# Patient Record
Sex: Male | Born: 1985 | Race: White | Hispanic: No | Marital: Single | Smoking: Never smoker
Health system: Southern US, Community
[De-identification: ages and names within clinical notes are randomized; demographics above are authoritative.]

## PROBLEM LIST (undated history)

## (undated) DIAGNOSIS — F845 Asperger's syndrome: Secondary | ICD-10-CM

---

## 2016-03-18 ENCOUNTER — Emergency Department (HOSPITAL_COMMUNITY)
Admission: EM | Admit: 2016-03-18 | Discharge: 2016-03-18 | Disposition: A | Discharge status: Home / Self Care | Payer: Medicaid Other | Attending: Emergency Medicine | Admitting: Emergency Medicine

## 2016-03-18 ENCOUNTER — Encounter (HOSPITAL_COMMUNITY): Payer: Self-pay | Admitting: Emergency Medicine

## 2016-03-18 DIAGNOSIS — R079 Chest pain, unspecified: Secondary | ICD-10-CM | POA: Diagnosis present

## 2016-03-18 DIAGNOSIS — Y999 Unspecified external cause status: Secondary | ICD-10-CM | POA: Insufficient documentation

## 2016-03-18 DIAGNOSIS — Y9389 Activity, other specified: Secondary | ICD-10-CM | POA: Insufficient documentation

## 2016-03-18 DIAGNOSIS — R110 Nausea: Secondary | ICD-10-CM | POA: Insufficient documentation

## 2016-03-18 DIAGNOSIS — M791 Myalgia: Secondary | ICD-10-CM | POA: Insufficient documentation

## 2016-03-18 DIAGNOSIS — Y92410 Unspecified street and highway as the place of occurrence of the external cause: Secondary | ICD-10-CM | POA: Diagnosis not present

## 2016-03-18 DIAGNOSIS — M7918 Myalgia, other site: Secondary | ICD-10-CM

## 2016-03-18 HISTORY — DX: Asperger's syndrome: F84.5
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
## 2016-03-19 ENCOUNTER — Other Ambulatory Visit: Payer: Self-pay | Admitting: Family Medicine

## 2016-03-19 ENCOUNTER — Ambulatory Visit
Admission: RE | Admit: 2016-03-19 | Discharge: 2016-03-19 | Disposition: A | Discharge status: Home / Self Care | Payer: Medicaid Other | Source: Ambulatory Visit | Attending: Family Medicine | Admitting: Family Medicine

## 2016-03-19 DIAGNOSIS — M542 Cervicalgia: Secondary | ICD-10-CM

## 2016-04-01 ENCOUNTER — Encounter: Payer: Self-pay | Admitting: Physical Therapy

## 2016-04-01 ENCOUNTER — Ambulatory Visit: Payer: Medicaid Other | Attending: Family Medicine | Admitting: Physical Therapy

## 2016-04-01 DIAGNOSIS — M542 Cervicalgia: Secondary | ICD-10-CM

## 2016-04-01 DIAGNOSIS — R252 Cramp and spasm: Secondary | ICD-10-CM | POA: Insufficient documentation
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
## 2016-04-02 ENCOUNTER — Emergency Department (HOSPITAL_COMMUNITY): Payer: Medicaid Other

## 2016-04-02 ENCOUNTER — Emergency Department (HOSPITAL_COMMUNITY)
Admission: EM | Admit: 2016-04-02 | Discharge: 2016-04-02 | Disposition: A | Discharge status: Home / Self Care | Payer: Medicaid Other | Attending: Emergency Medicine | Admitting: Emergency Medicine

## 2016-04-02 ENCOUNTER — Encounter (HOSPITAL_COMMUNITY): Payer: Self-pay | Admitting: Emergency Medicine

## 2016-04-02 DIAGNOSIS — T6291XA Toxic effect of unspecified noxious substance eaten as food, accidental (unintentional), initial encounter: Secondary | ICD-10-CM | POA: Diagnosis not present

## 2016-04-02 DIAGNOSIS — Z79899 Other long term (current) drug therapy: Secondary | ICD-10-CM | POA: Diagnosis not present

## 2016-04-02 DIAGNOSIS — R112 Nausea with vomiting, unspecified: Secondary | ICD-10-CM | POA: Diagnosis present

## 2016-04-02 DIAGNOSIS — A059 Bacterial foodborne intoxication, unspecified: Secondary | ICD-10-CM

## 2016-04-02 LAB — BASIC METABOLIC PANEL
ANION GAP: 5 (ref 5–15)
BUN: 22 mg/dL — AB (ref 6–20)
CALCIUM: 9.4 mg/dL (ref 8.9–10.3)
CO2: 26 mmol/L (ref 22–32)
Chloride: 105 mmol/L (ref 101–111)
Creatinine, Ser: 0.81 mg/dL (ref 0.61–1.24)
GFR calc Af Amer: >60 mL/min (ref >60)
GLUCOSE: 88 mg/dL (ref 65–99)
POTASSIUM: 4.1 mmol/L (ref 3.5–5.1)
SODIUM: 136 mmol/L (ref 135–145)

## 2016-04-02 LAB — I-STAT TROPONIN, ED: TROPONIN I, POC: 0 ng/mL (ref 0.00–0.08)

## 2016-04-02 LAB — CBC
HCT: 47.2 % (ref 39.0–52.0)
Hemoglobin: 16.3 g/dL (ref 13.0–17.0)
MCH: 28.7 pg (ref 26.0–34.0)
MCHC: 34.5 g/dL (ref 30.0–36.0)
MCV: 83.1 fL (ref 78.0–100.0)
PLATELETS: 290 10*3/uL (ref 150–400)
RBC: 5.68 MIL/uL (ref 4.22–5.81)
RDW: 12.6 % (ref 11.5–15.5)
WBC: 4.8 10*3/uL (ref 4.0–10.5)

## 2016-04-02 LAB — URINALYSIS, ROUTINE W REFLEX MICROSCOPIC
BILIRUBIN URINE: NEGATIVE
GLUCOSE, UA: NEGATIVE mg/dL
Hgb urine dipstick: NEGATIVE
KETONES UR: NEGATIVE mg/dL
LEUKOCYTES UA: NEGATIVE
Nitrite: NEGATIVE
PH: 5.5 (ref 5.0–8.0)
Protein, ur: NEGATIVE mg/dL
Specific Gravity, Urine: 1.014 (ref 1.005–1.030)

## 2016-04-02 MED ORDER — FAMOTIDINE IN NACL 20-0.9 MG/50ML-% IV SOLN
20.0000 mg | Freq: Two times a day (BID) | INTRAVENOUS | Status: DC
  Administered 2016-04-02: 20 mg via INTRAVENOUS
  Filled 2016-04-02: qty 50

## 2016-04-02 MED ORDER — FENTANYL CITRATE (PF) 100 MCG/2ML IJ SOLN
50.0000 ug | Freq: Once | INTRAMUSCULAR | Status: AC
  Administered 2016-04-02: 50 ug via INTRAVENOUS
  Filled 2016-04-02: qty 2

## 2016-04-02 MED ORDER — SODIUM CHLORIDE 0.9 % IV BOLUS (SEPSIS)
1000.0000 mL | Freq: Once | INTRAVENOUS | Status: AC
  Administered 2016-04-02: 1000 mL via INTRAVENOUS

## 2016-04-02 MED ORDER — ONDANSETRON HCL 4 MG/2ML IJ SOLN
4.0000 mg | Freq: Once | INTRAMUSCULAR | Status: AC
  Administered 2016-04-02: 4 mg via INTRAVENOUS
  Filled 2016-04-02: qty 2

## 2016-04-02 MED ORDER — ONDANSETRON 4 MG PREPACK (~~LOC~~): 1.0000 | ORAL_TABLET | Freq: Three times a day (TID) | ORAL | 0 refills | Status: AC | PRN

## 2016-04-02 NOTE — ED Provider Notes (Signed)
[facility]-EMERG[facility]Y DEPT Provider Note   CSN: [id number] Arrival date & time: 04/02/16  1191  First Provider Contact:  None       History   Chief Complaint Chief Complaint  Patient presents with  . Chest Pain  . Emesis    HPI Barry Dennis is a 30 y.o. male.  HPI Pt c/o chest pain, nausea, emesis, weakness, pallor, generalized abdominal pain onset this morning after eating questionable food last night  Past Medical History:  Diagnosis Date  . Asperger's disorder     There are no active problems to display for this patient.   History reviewed. No pertinent surgical history.     Home Medications    Prior to Admission medications   Medication Sig Start Date End Date Taking? Authorizing Provider  calcium carbonate (TUMS - DOSED IN MG ELEMENTAL CALCIUM) 500 MG chewable tablet Chew 1 tablet by mouth as needed for indigestion or heartburn.   Yes Historical Provider, MD  cyclobenzaprine (FLEXERIL) 10 MG tablet Take 10 mg by mouth at bedtime as needed for muscle spasms.  03/19/16  Yes Historical Provider, MD  escitalopram (LEXAPRO) 10 MG tablet Take 10 mg by mouth daily. 03/19/16  Yes Historical Provider, MD  meloxicam (MOBIC) 15 MG tablet Take 15 mg by mouth daily as needed for pain.  03/19/16  Yes Historical Provider, MD  ondansetron (ZOFRAN) 4 mg TABS tablet Take 4 tablets by mouth every 8 (eight) hours as needed. 04/02/16   Nelva Nay, MD    Family History History reviewed. No pertinent family history.  Social History Social History  Substance Use Topics  . Smoking status: Never Smoker  . Smokeless tobacco: Never Used  . Alcohol use No     Allergies   Review of patient's allergies indicates no known allergies.   Review of Systems Review of Systems All other systems reviewed and are negative  Physical Exam Updated Vital Signs BP 111/79 (BP Location: Left Arm)   Pulse 65   Temp 97.9 F (36.6 C) (Oral)   Resp 10   Ht  (1.778 m)   Wt 128 lb (58.1 kg)    SpO2 100%   BMI 18.37 kg/m   Physical Exam Physical Exam  Nursing note and vitals reviewed. Constitutional: He is oriented to person, place, and time. He appears well-developed and well-nourished. No distress.  HENT:  Head: Normocephalic and atraumatic.  Eyes: Pupils are equal, round, and reactive to light.  Neck: Normal range of motion.  Cardiovascular: Normal rate and intact distal pulses.   Pulmonary/Chest: No respiratory distress.  Abdominal: Normal appearance. He exhibits no distension.  mildly tender in the epigastric area.  No rebound or guarding tenderness.  Hyperactive bowel sounds in all quadrants. Musculoskeletal: Normal range of motion.  Neurological: He is alert and oriented to person, place, and time. No cranial nerve deficit.  Skin: Skin is warm and dry. No rash noted.  Psychiatric: He has a normal mood and affect. His behavior is normal.    ED Treatments / Results  Labs (all labs ordered are listed, but only abnormal results are displayed) Labs Reviewed  BASIC METABOLIC PANEL - Abnormal; Notable for the following:       Result Value   BUN 22 (*)    All other components within normal limits  CBC  URINALYSIS, ROUTINE W REFLEX MICROSCOPIC (NOT AT [facility])  Rosezena Sensor, ED    EKG  EKG Interpretation  Date/Time:  Tuesday April 02 2016 09:58:41 EDT Ventricular Rate:  73 PR Interval:    QRS Duration: 90 QT Interval:  375 QTC Calculation: 414 R Axis:   -21 Text Interpretation:  Sinus rhythm Borderline left axis deviation RSR' in V1 or V2, probably normal variant No previous tracing Confirmed by Tenelle Andreason  MD, Zivah Mayr [phone]([id number]) on 04/02/2016 12:26:19 PM       Radiology Dg Chest 2 View  ResultRadford Pax Date: 04/02/2016 CLINICAL DATA:  30 year old male with a history of sharp pain in the chest EXAM: CHEST  2 VIEW COMPARISON:  None. FINDINGS: The heart size and mediastinal contours are within normal limits. Both lungs are clear. The visualized skeletal structures are  unremarkable. IMPRESSION: No active cardiopulmonary disease. Signed, Yvone NeuJaime S. Loreta AveWagner, DO [facility] Electronically Signed   By: Gilmer MorJaime  Wagner D.O.   On: 04/02/2016 11:10    Procedures Procedures (including critical care time)  Medications Ordered in ED Medications  famotidine (PEPCID) IVPB 20 mg premix (0 mg Intravenous Stopped 04/02/16 1348)  ondansetron (ZOFRAN) injection 4 mg (4 mg Intravenous Given 04/02/16 1305)  fentaNYL (SUBLIMAZE) injection 50 mcg (50 mcg Intravenous Given 04/02/16 1305)  sodium chloride 0.9 % bolus 1,000 mL (0 mLs Intravenous Stopped 04/02/16 1414)  sodium chloride 0.9 % bolus 1,000 mL (0 mLs Intravenous Stopped 04/02/16 1526)     Initial Impression / Assessment and Plan / ED Course  I have reviewed the triage vital signs and the nursing notes.  Pertinent labs & imaging results that were available during my care of the patient were reviewed by me and considered in my medical decision making (see chart for details).   Patient has had no vomiting throughout his stay in the ED.  Still feels somewhat dizzy but otherwise feels back to baseline.  We'll discharge with Zofran and bland diet for the next 12 hours. Clinical Course  Value Comment By Time  ED EKG within 10 minutes (Reviewed) Nelva Nayobert Gabreille Dardis, MD 08/08 1225      Final Clinical Impressions(s) / ED Diagnoses   Final diagnoses:  Food poisoning    New Prescriptions New Prescriptions   ONDANSETRON (ZOFRAN) 4 MG TABS TABLET    Take 4 tablets by mouth every 8 (eight) hours as needed.     Nelva Nayobert Merriam Brandner, MD 04/02/16 [phone]

## 2016-04-02 NOTE — ED Triage Notes (Signed)
Pt c/o chest pain, nausea, emesis, weakness, pallor, generalized abdominal pain onset this morning after eating questionable food last night.

## 2016-04-02 NOTE — ED Notes (Signed)
PT DISCHARGED. INSTRUCTIONS AND PRESCRIPTION GIVEN. AAOX4. PT IN NO APPARENT DISTRESS OR PAIN. THE OPPORTUNITY TO ASK QUESTIONS WAS PROVIDED. 

## 2016-04-02 NOTE — ED Notes (Signed)
Patient aware that a urine sample is needed 

## 2016-04-02 NOTE — Progress Notes (Signed)
ED CM noted pt without a pcp listed and with medicaid [place] access [place]  Cm noted [facility] listed as pcp on pt medicaid response hx Cm [facility] at [facility] to confirm pt sees Dr Tally Joeavid Swayne  [facility]

## 2016-04-04 ENCOUNTER — Ambulatory Visit: Payer: Medicaid Other | Admitting: Physical Therapy

## 2016-04-08 ENCOUNTER — Ambulatory Visit: Payer: Medicaid Other | Admitting: Physical Therapy

## 2016-04-08 DIAGNOSIS — M542 Cervicalgia: Secondary | ICD-10-CM | POA: Diagnosis not present

## 2016-04-08 DIAGNOSIS — R252 Cramp and spasm: Secondary | ICD-10-CM

## 2016-04-08 NOTE — Therapy (Signed)
[facility] [address] [place], [place], [address] Phone: [phone]   Fax:  [phone]  Physical Therapy Treatment  Patient Details  Name: Barry CellaMatthew Dennis MRN: [id number] Date of Birth: 03-Dec-1985 Referring Provider: Tally Joeavid Swayne  Encounter Date: 04/08/2016      PT End of Session - 04/08/16 1643    Visit Number 2   Date for PT Re-Evaluation 06/01/16   PT Start Time 1610   PT Stop Time 1656   PT Time Calculation (min) 46 min   Activity Tolerance Patient tolerated treatment well   Behavior During Therapy [facility] for tasks assessed/performed      Past Medical History:  Diagnosis Date  . Asperger's disorder     No past surgical history on file.  There were no vitals filed for this visit.      Subjective Assessment - 04/08/16 1611    Subjective Pt was 10 minutes late today. Reports no pain today, just very stiff. Reports, being irritable today.    Limitations Reading   Currently in Pain? No/denies                         [facility] Adult PT Treatment/Exercise - 04/08/16 0001      Neck Exercises: Standing   Wall Push Ups 10 reps     Neck Exercises: Seated   Neck Retraction 5 reps   Cervical Rotation 5 reps   Lateral Flexion 5 reps   Shoulder Shrugs 20 reps   Shoulder Rolls 20 reps   Shoulder Rolls Limitations 10 fwd, 10 back   Other Seated Exercise Scap retraction 2x5; yellow tband   Other Seated Exercise OHP with yellow weighted ball x10     Lumbar Exercises: Standing   Wall Slides 10 reps   Wall Slides Limitations Green pball behind back   Shoulder Extension Strengthening;Both;20 reps   Theraband Level (Shoulder Extension) Level 2 (Red)     Modalities   Modalities Moist Heat;Electrical Stimulation     Moist Heat Therapy   Number Minutes Moist Heat 15 Minutes   Moist Heat Location Cervical     Electrical Stimulation   Electrical Stimulation Location Right upper trap into R  rhomboid   Electrical Stimulation Action IFC   Electrical Stimulation Parameters supine   Electrical Stimulation Goals Pain                  PT Short Term Goals - 04/01/16 1337      PT SHORT TERM GOAL #1   Title independent with initial HEP   Time 1   Period --   Status New     PT SHORT TERM GOAL #2   Title --   Time --   Period --   Status --     PT SHORT TERM GOAL #3   Title --   Time --   Period --   Status --     PT SHORT TERM GOAL #4   Title --   Time --   Period --   Status --           PT Long Term Goals - 04/01/16 1341      PT LONG TERM GOAL #1   Title understand proper posture and body mechanics   Time 8   Period Weeks   Status New     PT LONG TERM GOAL #2   Title decrease pain 50%   Time 8  Period Weeks   Status New     PT LONG TERM GOAL #3   Title decrease HA frequency 50%   Time 8   Period Weeks   Status New     PT LONG TERM GOAL #4   Title increase cervical ROM to WNL's   Time 8   Period Weeks   Status New               Plan - 04/08/16 1644    Clinical Impression Statement Pt entered the clinic with forward head/rounded shoulders posture and very limited cervical motion. Pt had slight increase in pain during cervical exercises. Pt required verbal cues for proper form and demo for proper posture throughout ther ex. Overall pt tolerated added exercises well.    Rehab Potential Good   PT Frequency 2x / week   PT Duration 8 weeks   PT Treatment/Interventions ADLs/Self Care Home Management;Electrical Stimulation;Moist Heat;Ultrasound;Patient/family education;Therapeutic exercise;Therapeutic activities;Manual techniques   PT Next Visit Plan Continue with exercises to promote upright posture and work on increasing cervical ROM.       Patient will benefit from skilled therapeutic intervention in order to improve the following deficits and impairments:  Decreased range of motion, Decreased strength, Increased muscle  spasms, Postural dysfunction, Improper body mechanics, Pain, Decreased activity tolerance  Visit Diagnosis: Cervicalgia  Cramp and spasm     Problem List There are no active problems to display for this patient.   Justus MemoryBrianna Angelyse Dennis, SPTA 04/08/2016, 4:49 PM  [facility] [address] [place], [place], [address] Phone: [phone]   Fax:  [phone]  Name: Barry CellaMatthew Hern MRN: [id number] Date of Birth: 1986/03/10

## 2016-04-15 ENCOUNTER — Encounter: Payer: Self-pay | Admitting: Physical Therapy

## 2016-04-15 ENCOUNTER — Ambulatory Visit: Payer: Medicaid Other | Admitting: Physical Therapy

## 2016-04-15 DIAGNOSIS — M542 Cervicalgia: Secondary | ICD-10-CM

## 2016-04-15 DIAGNOSIS — R252 Cramp and spasm: Secondary | ICD-10-CM

## 2016-04-15 NOTE — Therapy (Signed)
[facility] [address] [place], [place], [address] Phone: [phone]   Fax:  [phone]  Physical Therapy Treatment  Patient Details  Name: Barry CellaMatthew Dennis MRN: [id number] Date of Birth: [birth year] Referring Provider: Tally Joeavid Swayne  Encounter Date: 04/15/2016      PT End of Session - 04/15/16 1643    Visit Number 3   Date for PT Re-Evaluation 06/01/16   PT Start Time 1600   PT Stop Time 1655   PT Time Calculation (min) 55 min   Activity Tolerance Patient tolerated treatment well   Behavior During Therapy [facility] for tasks assessed/performed      Past Medical History:  Diagnosis Date  . Asperger's disorder     History reviewed. No pertinent surgical history.  There were no vitals filed for this visit.      Subjective Assessment - 04/15/16 1603    Subjective "Things are going good"   Currently in Pain? No/denies   Pain Score 0-No pain            [facility] PT Assessment - 04/15/16 0001      AROM   Overall AROM Comments Cervical ROM decreased 50% for all motions with some pain, side bending was decreased 50% with more pain, shoulder ROM WFL's                     [facility] Adult PT Treatment/Exercise - 04/15/16 0001      Neck Exercises: Machines for Strengthening   UBE (Upper Arm Bike) L2 652frd/2rev   Other Machines for Strengthening NuStep L3 x6 min    Other Machines for Strengthening Seated Rows ands lats 20lb 2x15     Neck Exercises: Standing   Wall Push Ups 10 reps     Shoulder Exercises: Standing   Extension Both;Theraband;12 reps   Theraband Level (Shoulder Extension) Level 2 (Red)   Row 12 reps;Theraband  x2   Theraband Level (Shoulder Row) Level 2 (Red)     Modalities   Modalities Moist Heat;Electrical Stimulation     Moist Heat Therapy   Number Minutes Moist Heat 15 Minutes   Moist Heat Location Cervical     Electrical Stimulation   Electrical Stimulation Location Right upper  trap into R rhomboid   Electrical Stimulation Action IFC   Electrical Stimulation Parameters supine to pt tolerance   Electrical Stimulation Goals Pain                  PT Short Term Goals - 04/15/16 1608      PT SHORT TERM GOAL #1   Title independent with initial HEP   Status Achieved           PT Long Term Goals - 04/15/16 1608      PT LONG TERM GOAL #2   Title decrease pain 50%   Status Achieved     PT LONG TERM GOAL #3   Title decrease HA frequency 50%   Status Achieved               Plan - 04/15/16 1643    Clinical Impression Statement Pt with a little increase in cervical side bending. Pt reports no pain  but VAS 3/5 with scapular stabilization exercises. Pt stated "It doesnt  hurt but is feel like something is stopping it from moving", VC's provided for proper posture throughout ther ex.   Rehab Potential Good   PT Frequency 2x / week   PT  Duration 8 weeks   PT Treatment/Interventions ADLs/Self Care Home Management;Electrical Stimulation;Moist Heat;Ultrasound;Patient/family education;Therapeutic exercise;Therapeutic activities;Manual techniques   PT Next Visit Plan Continue with exercises to promote upright posture and work on increasing cervical ROM.       Patient will benefit from skilled therapeutic intervention in order to improve the following deficits and impairments:  Decreased range of motion, Decreased strength, Increased muscle spasms, Postural dysfunction, Improper body mechanics, Pain, Decreased activity tolerance  Visit Diagnosis: Cervicalgia  Cramp and spasm     Problem List There are no active problems to display for this patient.   Grayce Sessionsonald G Altonio Schwertner, PTA  04/15/2016, 4:49 PM  [facility] [address] [place], [place], [address] Phone: [phone]   Fax:  [phone]  Name: Barry CellaMatthew Dennis MRN: [id number] Date of Birth: 07/10/1986

## 2016-04-22 ENCOUNTER — Ambulatory Visit: Payer: Medicaid Other | Admitting: Physical Therapy

## 2016-04-22 ENCOUNTER — Encounter: Payer: Self-pay | Admitting: Physical Therapy

## 2016-04-22 DIAGNOSIS — M542 Cervicalgia: Secondary | ICD-10-CM | POA: Diagnosis not present

## 2016-04-22 DIAGNOSIS — R252 Cramp and spasm: Secondary | ICD-10-CM

## 2016-04-22 NOTE — Therapy (Signed)
[facility] [address] [place], [place], [address] Phone: [phone]   Fax:  [phone]  Physical Therapy Treatment  Patient Details  Name: Armin Yerger MRN: [id number] Date of Birth: 04-Nov-1985 Referring Provider: Tally Joe  Encounter Date: 04/22/2016      PT End of Session - 04/22/16 1633    Visit Number 4   Date for PT Re-Evaluation 06/01/16   PT Start Time 1558   PT Stop Time 1633   PT Time Calculation (min) 35 min   Activity Tolerance Patient tolerated treatment well   Behavior During Therapy [facility] for tasks assessed/performed      Past Medical History:  Diagnosis Date  . Asperger's disorder     History reviewed. No pertinent surgical history.  There were no vitals filed for this visit.      Subjective Assessment - 04/22/16 1600    Subjective "its been getting better, I wont think their no more stiffness or weirdness"   Currently in Pain? No/denies   Pain Score 0-No pain            [facility] PT Assessment - 04/22/16 0001      AROM   Overall AROM Comments Cervical ROM WFL     Strength   Overall Strength Comments 5/5 bilat UE                      [facility] Adult PT Treatment/Exercise - 04/22/16 0001      Neck Exercises: Machines for Strengthening   UBE (Upper Arm Bike) L2 28frd/3rev   Other Machines for Strengthening NuStep L6 x6 min    Other Machines for Strengthening Seated Rows ands lats 20lb 2x10     Neck Exercises: Standing   Wall Push Ups 10 reps     Neck Exercises: Seated   Neck Retraction 3 secs;10 reps   Shoulder Shrugs 20 reps   Shoulder Shrugs Limitations 7lb   Shoulder Rolls 15 reps   Shoulder Rolls Limitations 5lb bak   Shoulder Flexion 15 reps   Shoulder Flexion Weights (lbs) 3     Lumbar Exercises: Standing   Row Strengthening;Both;20 reps;Theraband   Theraband Level (Row) Level 2 (Red)   Shoulder Extension Strengthening;Both;20 reps   Theraband Level  (Shoulder Extension) Level 2 (Red)                  PT Short Term Goals - 04/15/16 1608      PT SHORT TERM GOAL #1   Title independent with initial HEP   Status Achieved           PT Long Term Goals - 04/22/16 1616      PT LONG TERM GOAL #1   Title understand proper posture and body mechanics   Status On-going     PT LONG TERM GOAL #2   Title decrease pain 50%   Status Achieved     PT LONG TERM GOAL #3   Title decrease HA frequency 50%   Status Achieved     PT LONG TERM GOAL #4   Title increase cervical ROM to WNL's   Status Achieved               Plan - 04/22/16 1633    Clinical Impression Statement Pt tolerated treatment well and has progressed meeting some LTG's. No reports of increase pain during today's interventions.   Rehab Potential Good   PT Frequency 2x / week  PT Duration 8 weeks   PT Treatment/Interventions ADLs/Self Care Home Management;Electrical Stimulation;Moist Heat;Ultrasound;Patient/family education;Therapeutic exercise;Therapeutic activities;Manual techniques   PT Next Visit Plan Continue with exercises to promote upright posture and work on increasing cervical ROM.       Patient will benefit from skilled therapeutic intervention in order to improve the following deficits and impairments:  Decreased range of motion, Decreased strength, Increased muscle spasms, Postural dysfunction, Improper body mechanics, Pain, Decreased activity tolerance  Visit Diagnosis: Cervicalgia  Cramp and spasm     Problem List There are no active problems to display for this patient.   Grayce Sessionsonald G Jannine Abreu, PTA 04/22/2016, 4:36 PM  [facility] [address] [place], [place], [address] Phone: [phone]   Fax:  [phone]  Name: Leavy CellaMatthew Kimbrough MRN: [id number] Date of Birth: Jan 02, 1986

## 2016-05-06 ENCOUNTER — Ambulatory Visit: Payer: Medicaid Other | Attending: Family Medicine | Admitting: Physical Therapy

## 2016-05-06 DIAGNOSIS — R252 Cramp and spasm: Secondary | ICD-10-CM | POA: Insufficient documentation

## 2016-05-06 DIAGNOSIS — M542 Cervicalgia: Secondary | ICD-10-CM | POA: Insufficient documentation

## 2016-05-09 ENCOUNTER — Encounter: Payer: Medicaid Other | Admitting: Physical Therapy

## 2016-05-10 ENCOUNTER — Ambulatory Visit: Payer: Medicaid Other | Admitting: Physical Therapy

## 2016-05-14 ENCOUNTER — Ambulatory Visit: Payer: Medicaid Other | Admitting: Physical Therapy

## 2016-05-14 ENCOUNTER — Encounter: Payer: Self-pay | Admitting: Physical Therapy

## 2016-05-14 DIAGNOSIS — M542 Cervicalgia: Secondary | ICD-10-CM | POA: Diagnosis present

## 2016-05-14 DIAGNOSIS — R252 Cramp and spasm: Secondary | ICD-10-CM | POA: Diagnosis present

## 2016-05-14 NOTE — Therapy (Signed)
[facility] [address] [place], [place], [address] Phone: [phone]   Fax:  [phone]  Physical Therapy Treatment  Patient Details  Name: Barry Dennis MRN: [id number] Date of Birth: Jan 14, 1986 Referring Provider: Tally Joe  Encounter Date: 05/14/2016      PT End of Session - 05/14/16 1649    Visit Number 5   Date for PT Re-Evaluation 06/01/16   PT Start Time 1637   PT Stop Time 1726   PT Time Calculation (min) 49 min   Activity Tolerance Patient tolerated treatment well   Behavior During Therapy [facility] for tasks assessed/performed      Past Medical History:  Diagnosis Date  . Asperger's disorder     History reviewed. No pertinent surgical history.  There were no vitals filed for this visit.      Subjective Assessment - 05/14/16 1640    Subjective Patient reports that he has been back to work over the past two weeks, reports that work and stress seems to have increased the pain in the neck   Currently in Pain? Yes   Pain Score 5    Pain Location Neck   Pain Descriptors / Indicators Aching;Tightness   Pain Type Acute pain   Aggravating Factors  stress and work seems to hvae increased some of the pain                         [facility] Adult PT Treatment/Exercise - 05/14/16 0001      Neck Exercises: Machines for Strengthening   UBE (Upper Arm Bike) L2 87frd/3rev   Other Machines for Strengthening NuStep L6 x6 min    Other Machines for Strengthening Seated Rows ands lats 20lb 2x10     Neck Exercises: Standing   Neck Retraction 10 reps   Wall Push Ups 10 reps     Neck Exercises: Seated   Money 20 reps;3 secs   Shoulder Shrugs 20 reps   Shoulder Shrugs Limitations 7lb   Shoulder Rolls 15 reps   Shoulder Rolls Limitations 5#     Lumbar Exercises: Standing   Row Strengthening;Both;20 reps;Theraband   Theraband Level (Row) Level 2 (Red)   Shoulder Extension Strengthening;Both;20  reps   Theraband Level (Shoulder Extension) Level 2 (Red)     Moist Heat Therapy   Number Minutes Moist Heat 15 Minutes   Moist Heat Location Cervical     Electrical Stimulation   Electrical Stimulation Location Right upper trap into R rhomboid   Electrical Stimulation Action IFC   Electrical Stimulation Parameters supine   Electrical Stimulation Goals Pain                  PT Short Term Goals - 04/15/16 1608      PT SHORT TERM GOAL #1   Title independent with initial HEP   Status Achieved           PT Long Term Goals - 05/14/16 1706      PT LONG TERM GOAL #1   Title understand proper posture and body mechanics   Status Achieved               Plan - 05/14/16 1655    Clinical Impression Statement Patient with increased pain today, has reports of increased stress at work, he was very tender and gaurded to touch in the upper traps and in the cervical area.        Patient  will benefit from skilled therapeutic intervention in order to improve the following deficits and impairments:  Decreased range of motion, Decreased strength, Increased muscle spasms, Postural dysfunction, Improper body mechanics, Pain, Decreased activity tolerance  Visit Diagnosis: Cervicalgia  Cramp and spasm     Problem List There are no active problems to display for this patient.   Jearld LeschALBRIGHT,Rie Mcneil W., PT 05/14/2016, 5:07 PM  [facility] [address] [place], [place], [address] Phone: [phone]   Fax:  [phone]  Name: Barry Dennis MRN: [id number] Date of Birth: 04-16-1986

## 2016-05-22 ENCOUNTER — Ambulatory Visit: Payer: Medicaid Other | Admitting: Physical Therapy

## 2016-05-30 ENCOUNTER — Ambulatory Visit: Payer: Medicaid Other | Attending: Family Medicine | Admitting: Physical Therapy

## 2016-05-30 ENCOUNTER — Encounter: Payer: Self-pay | Admitting: Physical Therapy

## 2016-05-30 DIAGNOSIS — R252 Cramp and spasm: Secondary | ICD-10-CM | POA: Diagnosis present

## 2016-05-30 DIAGNOSIS — M542 Cervicalgia: Secondary | ICD-10-CM | POA: Diagnosis present

## 2016-05-30 NOTE — Therapy (Signed)
[address] [place], [place], [address] Phone: [phone]   Fax:  [phone]  Physical Therapy Treatment  Patient Details  Name: Barry Dennis MRN: [id number] Date of Birth: [birth year] Referring Provider: Antony Contras  Encounter Date: 05/30/2016      PT End of Session - 05/30/16 1725    Visit Number 6   Date for PT Re-Evaluation 06/01/16   PT Start Time 1700   PT Stop Time 1745   PT Time Calculation (min) 45 min      Past Medical History:  Diagnosis Date  . Asperger's disorder     History reviewed. No pertinent surgical history.  There were no vitals filed for this visit.      Subjective Assessment - 05/30/16 1702    Subjective "really sore since RTW, maybe stress"   Currently in Pain? Yes   Pain Score 6    Pain Location Neck   Pain Descriptors / Indicators Sore                         [facility] Adult PT Treatment/Exercise - 05/30/16 0001      Self-Care   Self-Care Posture   Posture pt with very poor posture fwd head and rounded shld  educated on better posture for sitting and standing     Neck Exercises: Machines for Strengthening   UBE (Upper Arm Bike) L 3 3 fwd/3 back   Other Machines for Strengthening Seated Rows ands lats 20lb 2x10     Neck Exercises: Standing   Neck Retraction 10 reps   Other Standing Exercises 5# shruggs and backward rolls 15 times each   Other Standing Exercises ball vs wall 5 times CC and CW     Moist Heat Therapy   Number Minutes Moist Heat 15 Minutes   Moist Heat Location Cervical     Electrical Stimulation   Electrical Stimulation Location Right upper trap into R rhomboid   Electrical Stimulation Action IFC   Electrical Stimulation Parameters supine   Electrical Stimulation Goals Pain                PT Education - 05/30/16 1724    Education provided Yes   Education Details reviewed HEP and instructed to do thru day to  keep muscles relaxed vs waiting until night time   Person(s) Educated Patient   Methods Explanation;Demonstration   Comprehension Verbalized understanding;Returned demonstration          PT Short Term Goals - 04/15/16 1608      PT SHORT TERM GOAL #1   Title independent with initial HEP   Status Achieved           PT Long Term Goals - 05/30/16 1706      PT LONG TERM GOAL #1   Status Achieved               Plan - 05/30/16 1726    Clinical Impression Statement all goals were met until pt resumed work. ROM WFLS but c/o pain. Reviewed HEP and instructed pt to do thru day to keep muscles relaxed vs waiting until night and when hurting-pt verb understanding. Pt with very poor posture, very rounded shld and fwd head- educated on importance of improving posture to decrease strain on neck.   PT Next Visit Plan Continue with exercises to promote upright posture and postural strengthening. Pt will need renewal if resumes PT services.  Patient will benefit from skilled therapeutic intervention in order to improve the following deficits and impairments:  Decreased range of motion, Decreased strength, Increased muscle spasms, Postural dysfunction, Improper body mechanics, Pain, Decreased activity tolerance  Visit Diagnosis: Cervicalgia  Cramp and spasm     Problem List There are no active problems to display for this patient.   Khylon Davies,ANGIE  PTA 05/30/2016, 5:29 PM  [address] [place], [place], [address] Phone: [phone]   Fax:  [phone]  Name: Barry Dennis MRN: [id number] Date of Birth: [birth year]
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
## 2019-12-02 ENCOUNTER — Ambulatory Visit: Payer: Medicaid Other | Attending: Internal Medicine

## 2019-12-02 DIAGNOSIS — Z23 Encounter for immunization: Secondary | ICD-10-CM

## 2019-12-02 NOTE — Progress Notes (Signed)
   Covid-19 Vaccination Clinic  Name:  Daxten Kovalenko    MRN: [id number] DOB: 06-07-1986  12/02/2019  Mr. Wieczorek was observed post Covid-19 immunization for 15 minutes without incident. He was provided with Vaccine Information Sheet and instruction to access the V-Safe system.   Mr. Gahm was instructed to call 911 with any severe reactions post vaccine: Marland Kitchen Difficulty breathing  . Swelling of face and throat  . A fast heartbeat  . A bad rash all over body  . Dizziness and weakness   Immunizations Administered    Name Date Dose VIS Date Route   Pfizer COVID-19 Vaccine 12/02/2019  3:01 PM 0.3 mL 08/06/2019 Intramuscular   Manufacturer: [employer], [employer]   Lot: [id number]   NDC: [address]-8

## 2019-12-27 ENCOUNTER — Ambulatory Visit: Payer: Medicaid Other | Attending: Internal Medicine

## 2019-12-27 DIAGNOSIS — Z23 Encounter for immunization: Secondary | ICD-10-CM

## 2019-12-27 NOTE — Progress Notes (Signed)
   Covid-19 Vaccination Clinic  Name:  Saafir Abdullah    MRN: [id number] DOB: 10-Nov-1985  12/27/2019  Mr. Bilodeau was observed post Covid-19 immunization for 15 minutes without incident. He was provided with Vaccine Information Sheet and instruction to access the V-Safe system.   Mr. Silversmith was instructed to call 911 with any severe reactions post vaccine: Marland Kitchen Difficulty breathing  . Swelling of face and throat  . A fast heartbeat  . A bad rash all over body  . Dizziness and weakness   Immunizations Administered    Name Date Dose VIS Date Route   Pfizer COVID-19 Vaccine 12/27/2019  2:54 PM 0.3 mL 10/20/2018 Intramuscular   Manufacturer: [employer], [employer]   Lot: [id number]   NDC: [address]-9

## 2022-08-19 ENCOUNTER — Other Ambulatory Visit: Payer: Self-pay

## 2022-08-19 ENCOUNTER — Emergency Department (HOSPITAL_COMMUNITY)
Admission: EM | Admit: 2022-08-19 | Discharge: 2022-08-19 | Disposition: A | Discharge status: Home / Self Care | Payer: BLUE CROSS/BLUE SHIELD | Attending: Emergency Medicine | Admitting: Emergency Medicine

## 2022-08-19 ENCOUNTER — Encounter (HOSPITAL_COMMUNITY): Payer: Self-pay

## 2022-08-19 ENCOUNTER — Emergency Department (HOSPITAL_COMMUNITY): Payer: BLUE CROSS/BLUE SHIELD

## 2022-08-19 DIAGNOSIS — J3489 Other specified disorders of nose and nasal sinuses: Secondary | ICD-10-CM | POA: Insufficient documentation

## 2022-08-19 DIAGNOSIS — R519 Headache, unspecified: Secondary | ICD-10-CM | POA: Insufficient documentation

## 2022-08-19 DIAGNOSIS — R059 Cough, unspecified: Secondary | ICD-10-CM | POA: Diagnosis present

## 2022-08-19 DIAGNOSIS — Z1152 Encounter for screening for COVID-19: Secondary | ICD-10-CM | POA: Diagnosis not present

## 2022-08-19 DIAGNOSIS — J069 Acute upper respiratory infection, unspecified: Secondary | ICD-10-CM

## 2022-08-19 LAB — RESP PANEL BY RT-PCR (RSV, FLU A&B, COVID)  RVPGX2
Influenza A by PCR: NEGATIVE
Influenza B by PCR: NEGATIVE
Resp Syncytial Virus by PCR: NEGATIVE
SARS Coronavirus 2 by RT PCR: NEGATIVE

## 2022-08-19 NOTE — ED Provider Notes (Signed)
[facility]-EMERGENCY DEPT Provider Note   CSN: [id number] Arrival date & time: 08/19/22  1247     History  Chief Complaint  Patient presents with   Cough   Nasal Congestion   Headache    Barry Dennis is a 36 y.o. male.  With no past medical history who presents ED for evaluation of upper respiratory symptoms including headache, nasal congestion, rhinorrhea, dry cough.  Symptoms began 4 to 5 days ago.  He has not tried any home remedies.  He works for an [employer] And states that he is exposed to sick people daily.  He denies chest pain, shortness of breath, productive cough, fevers, chills   Cough Associated symptoms: headaches and rhinorrhea   Headache Associated symptoms: congestion and cough        Home Medications Prior to Admission medications   Medication Sig Start Date End Date Taking? Authorizing Provider  calcium carbonate (TUMS - DOSED IN MG ELEMENTAL CALCIUM) 500 MG chewable tablet Chew 1 tablet by mouth as needed for indigestion or heartburn.    [provider]  cyclobenzaprine (FLEXERIL) 10 MG tablet Take 10 mg by mouth at bedtime as needed for muscle spasms.  03/19/16   [provider]  escitalopram (LEXAPRO) 10 MG tablet Take 10 mg by mouth daily. 03/19/16   [provider]  meloxicam (MOBIC) 15 MG tablet Take 15 mg by mouth daily as needed for pain.  03/19/16   [provider]  ondansetron (ZOFRAN) 4 mg TABS tablet Take 4 tablets by mouth every 8 (eight) hours as needed. 04/02/16   Nelva Nay, MD      Allergies    Patient has no known allergies.    Review of Systems   Review of Systems  HENT:  Positive for congestion and rhinorrhea.   Respiratory:  Positive for cough.   Neurological:  Positive for headaches.  All other systems reviewed and are negative.   Physical Exam Updated Vital Signs BP (!) 126/98 (BP Location: Left Arm)   Pulse 83   Temp 98.2 F (36.8 C) (Oral)   Resp 18   Ht 5\' 10"   (1.778 m)   Wt 74.8 kg   SpO2 98%   BMI 23.68 kg/m  Physical Exam Vitals and nursing note reviewed.  Constitutional:      General: He is not in acute distress.    Appearance: He is well-developed and normal weight. He is not ill-appearing, toxic-appearing or diaphoretic.  HENT:     Head: Normocephalic and atraumatic.     Nose: Congestion and rhinorrhea present.     Mouth/Throat:     Mouth: Mucous membranes are moist.     Pharynx: Oropharynx is clear. No oropharyngeal exudate or posterior oropharyngeal erythema.  Eyes:     Conjunctiva/sclera: Conjunctivae normal.  Cardiovascular:     Rate and Rhythm: Normal rate and regular rhythm.     Heart sounds: No murmur heard. Pulmonary:     Effort: Pulmonary effort is normal. No respiratory distress.     Breath sounds: Normal breath sounds. No stridor. No wheezing, rhonchi or rales.  Abdominal:     Palpations: Abdomen is soft.     Tenderness: There is no abdominal tenderness.  Musculoskeletal:        General: No swelling.     Cervical back: Neck supple.  Skin:    General: Skin is warm and dry.     Capillary Refill: Capillary refill takes less than 2 seconds.  Neurological:  Mental Status: He is alert.  Psychiatric:        Mood and Affect: Mood normal.     ED Results / Procedures / Treatments   Labs (all labs ordered are listed, but only abnormal results are displayed) Labs Reviewed  RESP PANEL BY RT-PCR (RSV, FLU A&B, COVID)  [id number]    EKG None  Radiology No results found.  Procedures Procedures    Medications Ordered in ED Medications - No data to display  ED Course/ Medical Decision Making/ A&P                           Medical Decision Making This patient presents to the ED for concern of URI symptoms, this involves an extensive number of treatment options, and is a complaint that carries with it a high risk of complications and morbidity.  The differential diagnosis includes flu, COVID, RSV, other viral  URI   My initial workup includes respiratory panel  Additional history obtained from: Nursing notes from this visit.  I ordered, reviewed and interpreted labs which include: Respiratory panel.  Negative.  Afebrile, hemodynamically stable.  36 year old male with no significant past medical history who presents ED for evaluation of upper respiratory infection symptoms.  Symptoms have been present for 45 days.  He has not tried any home remedies.  Physical exam is remarkable for congestion and rhinorrhea and dry cough.  There are no adventitious breath sounds.  Oropharynx appears normal.  Respiratory panel negative.  Patient likely has an upper respiratory infection from another virus.  Low suspicion for superimposed bacterial pneumonia.  He was given information regarding symptomatic treatment.  He was given a work note.  Stable at discharge.  At this time there does not appear to be any evidence of an acute emergency medical condition and the patient appears stable for discharge with appropriate outpatient follow up. Diagnosis was discussed with patient who verbalizes understanding of care plan and is agreeable to discharge. I have discussed return precautions with patient who verbalizes understanding. Patient encouraged to follow-up with their PCP within 1 week. All questions answered.  Note: Portions of this report may have been transcribed using voice recognition software. Every effort was made to ensure accuracy; however, inadvertent computerized transcription errors may still be present.         Final Clinical Impression(s) / ED Diagnoses Final diagnoses:  None    Rx / DC Orders ED Discharge Orders     None         Mora Bellman 08/19/22 1526    Benjiman Core, MD 08/20/22 [id number]

## 2022-08-19 NOTE — ED Triage Notes (Signed)
Patient c/o headache, nasal congestion, and a cough x 4 days.

## 2022-08-19 NOTE — Discharge Instructions (Signed)
You have been seen today for your complaint of upper respiratory infection. Your lab work was negative. Your discharge medications include Alternate tylenol and ibuprofen for pain and fevers. You may alternate these every 4 hours. You may take up to 800 mg of ibuprofen at a time and up to 1000 mg of tylenol.  You may take Claritin-D and Flonase for your congestion and runny nose.  These over-the-counter medications.  You should follow dosing instructions on the packaging.  You may also use Delsym or Mucinex for cough.  You may drink warm liquids with honey for sore throat. Home care instructions are as follows:  Drink plenty of fluids Follow up with: Your primary care provider in 1 week for reassessment Please seek immediate medical care if you develop any of the following symptoms: You have shortness of breath that gets worse. You have severe or persistent: Headache. Ear pain. Sinus pain. Chest pain. You have chronic lung disease along with any of the following: Making high-pitched whistling sounds when you breathe, most often when you breathe out (wheezing). Prolonged cough (more than 14 days). Coughing up blood. A change in your usual mucus. You have a stiff neck. You have changes in your: Vision. Hearing. Thinking. Mood. At this time there does not appear to be the presence of an emergent medical condition, however there is always the potential for conditions to change. Please read and follow the below instructions.  Do not take your medicine if  develop an itchy rash, swelling in your mouth or lips, or difficulty breathing; call 911 and seek immediate emergency medical attention if this occurs.  You may review your lab tests and imaging results in their entirety on your MyChart account.  Please discuss all results of fully with your primary care provider and other specialist at your follow-up visit.  Note: Portions of this text may have been transcribed using voice recognition  software. Every effort was made to ensure accuracy; however, inadvertent computerized transcription errors may still be present.
